# Patient Record
Sex: Male | Born: 1990 | Hispanic: No | Marital: Single | Smoking: Never smoker
Health system: Southern US, Community
[De-identification: ages and names within clinical notes are randomized; demographics above are authoritative.]

---
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
## 2013-07-07 ENCOUNTER — Encounter (HOSPITAL_COMMUNITY): Payer: Self-pay | Admitting: Emergency Medicine

## 2013-07-07 ENCOUNTER — Emergency Department (HOSPITAL_COMMUNITY)
Admission: EM | Admit: 2013-07-07 | Discharge: 2013-07-07 | Disposition: A | Discharge status: Home / Self Care | Payer: BC Managed Care – PPO | Attending: Emergency Medicine | Admitting: Emergency Medicine

## 2013-07-07 ENCOUNTER — Emergency Department (HOSPITAL_COMMUNITY): Payer: BC Managed Care – PPO

## 2013-07-07 DIAGNOSIS — R079 Chest pain, unspecified: Secondary | ICD-10-CM

## 2013-07-07 DIAGNOSIS — Z7982 Long term (current) use of aspirin: Secondary | ICD-10-CM | POA: Insufficient documentation

## 2013-07-07 LAB — CBC
HEMATOCRIT: 44.1 % (ref 39.0–52.0)
HEMOGLOBIN: 15.3 g/dL (ref 13.0–17.0)
MCH: 30.8 pg (ref 26.0–34.0)
MCHC: 34.7 g/dL (ref 30.0–36.0)
MCV: 88.9 fL (ref 78.0–100.0)
Platelets: 220 10*3/uL (ref 150–400)
RBC: 4.96 MIL/uL (ref 4.22–5.81)
RDW: 14.3 % (ref 11.5–15.5)
WBC: 5 10*3/uL (ref 4.0–10.5)

## 2013-07-07 LAB — BASIC METABOLIC PANEL
BUN: 13 mg/dL (ref 6–23)
CALCIUM: 9.8 mg/dL (ref 8.4–10.5)
CO2: 26 mEq/L (ref 19–32)
CREATININE: 0.93 mg/dL (ref 0.50–1.35)
Chloride: 103 mEq/L (ref 96–112)
GLUCOSE: 97 mg/dL (ref 70–99)
POTASSIUM: 4.4 meq/L (ref 3.7–5.3)
Sodium: 141 mEq/L (ref 137–147)

## 2013-07-07 LAB — I-STAT TROPONIN, ED: Troponin i, poc: 0 ng/mL (ref 0.00–0.08)

## 2013-07-07 MED ORDER — IBUPROFEN 800 MG PO TABS
800.0000 mg | ORAL_TABLET | Freq: Once | ORAL | Status: AC
  Administered 2013-07-07: 800 mg via ORAL
  Filled 2013-07-07: qty 1

## 2013-07-07 NOTE — ED Provider Notes (Signed)
CSN: [id number]     Arrival date & time 07/07/13  1018 History   First MD Initiated Contact with Patient 07/07/13 1225     Chief Complaint  Patient presents with  . Chest Pain    HPI  Eric Mcclure is a 23 y.o. male with no PMH who presents to the ED for evaluation of chest pain.  History was provided by the patient. Patient states that he woke up this morning at 3 am with left sided chest pain without radiation. His pain is constant and describes as a sharp pain. Movement and deep breathing makes his pain worse. He states he had a "raspy voice" with his chest pain this morning but this has resolved. He took 200 mg Ibuprofen with no relief in his pain. He has never had similar pain in the past. He states he worked out two days ago with chest lifting and didn't have any problems. He went to an urgent care today where he had an abnormal EKG and sent to the ED. Patient given aspirin. He denies any nausea, diaphoresis, lightheadedness, with his chest pain. Has been stressed with college but otherwise well. No fevers, rhinorrhea, congestion, sore throat, abdominal pain, emesis, dysuria, diarrhea, constipation, headaches, or weakness. No tobacco hx. No cocaine use. No significant FH of cardiac diease. No history of DVT, PE, clotting disorder, cancer, recent surgery, immobilization, travel.    History reviewed. No pertinent past medical history. History reviewed. No pertinent past surgical history. History reviewed. No pertinent family history. History  Substance Use Topics  . Smoking status: Never Smoker   . Smokeless tobacco: Not on file  . Alcohol Use: Yes    Review of Systems  Constitutional: Negative for fever, chills, diaphoresis, activity change, appetite change and fatigue.  HENT: Negative for congestion, rhinorrhea and sore throat.   Eyes: Negative for photophobia and visual disturbance.  Respiratory: Negative for cough, shortness of breath and wheezing.   Cardiovascular: Positive  for chest pain. Negative for leg swelling.  Gastrointestinal: Negative for nausea, vomiting, abdominal pain, diarrhea and constipation.  Genitourinary: Negative for dysuria.  Musculoskeletal: Negative for back pain, myalgias and neck pain.  Skin: Negative for rash.  Neurological: Negative for dizziness, syncope, weakness, light-headedness, numbness and headaches.     Allergies  Review of patient's allergies indicates no known allergies.  Home Medications   Current Outpatient Rx  Name  Route  Sig  Dispense  Refill  . aspirin 81 MG tablet   Oral   Take 243 mg by mouth once.         Marland Kitchen ibuprofen (ADVIL,MOTRIN) 200 MG tablet   Oral   Take 200 mg by mouth every 6 (six) hours as needed.          BP 128/78  Pulse 78  Temp(Src) 97.8 F (36.6 C) (Oral)  Resp 26  Ht 5\' 6"  (1.676 m)  Wt 179 lb 6.4 oz (81.375 kg)  BMI 28.97 kg/m2  SpO2 100%  Filed Vitals:   07/07/13 1245 07/07/13 1300 07/07/13 1315 07/07/13 1332  BP: 127/84 133/90 127/71 131/87  Pulse: 71 70 79 73  Temp:      TempSrc:      Resp: 24 20 23 23   Height:      Weight:      SpO2: 100% 100% 99% 100%    Physical Exam  Nursing note and vitals reviewed. Constitutional: He is oriented to person, place, and time. He appears well-developed and well-nourished. No distress.  HENT:  Head: Normocephalic and atraumatic.  Right Ear: External ear normal.  Left Ear: External ear normal.  Nose: Nose normal.  Mouth/Throat: Oropharynx is clear and moist.  Eyes: Conjunctivae are normal. Right eye exhibits no discharge. Left eye exhibits no discharge.  Neck: Normal range of motion. Neck supple.  Cardiovascular: Normal rate, regular rhythm, normal heart sounds and intact distal pulses.  Exam reveals no gallop and no friction rub.   No murmur heard. Dorsalis pedis pulses present and equal bilaterally  Pulmonary/Chest: Effort normal and breath sounds normal. No respiratory distress. He has no wheezes. He has no rales. He  exhibits tenderness.  Mid-sternal tenderness to palpation. Pain increased with left shoulder pulled across his chest   Abdominal: Soft. He exhibits no distension and no mass. There is no tenderness. There is no rebound and no guarding.  Musculoskeletal: Normal range of motion. He exhibits no edema and no tenderness.  No LE edema or calf tenderness bilaterally  Neurological: He is alert and oriented to person, place, and time.  Skin: Skin is warm and dry. He is not diaphoretic.    ED Course  Procedures (including critical care time) Labs Review Labs Reviewed  CBC  BASIC METABOLIC PANEL  Rosezena Sensor, ED   Imaging Review Dg Chest 2 View  07/07/2013   CLINICAL DATA:  Chest pain  EXAM: CHEST  2 VIEW  COMPARISON:  November 25, 2006  FINDINGS: Lungs are clear. Heart size and pulmonary vascularity are normal. No adenopathy. No bone lesions. No pneumothorax.  IMPRESSION: No abnormality noted.   Electronically Signed   By: Bretta Bang M.D.   On: 07/07/2013 10:49     EKG Interpretation   Date/Time:  Friday July 07 2013 10:26:52 EST Ventricular Rate:  77 PR Interval:  182 QRS Duration: 84 QT Interval:  362 QTC Calculation: 409 R Axis:   80 Text Interpretation:  Normal sinus rhythm Early repolarization Normal ECG  No previous tracing Confirmed by Anitra Lauth  MD, Alphonzo Lemmings ([id number]) on 07/07/2013  1:31:42 PM      Results for orders placed during the hospital encounter of 07/07/13  CBC      Result Value Ref Range   WBC 5.0  4.0 - 10.5 K/uL   RBC 4.96  4.22 - 5.81 MIL/uL   Hemoglobin 15.3  13.0 - 17.0 g/dL   HCT 60.4  54.0 - 98.1 %   MCV 88.9  78.0 - 100.0 fL   MCH 30.8  26.0 - 34.0 pg   MCHC 34.7  30.0 - 36.0 g/dL   RDW 19.1  47.8 - 29.5 %   Platelets 220  150 - 400 K/uL  BASIC METABOLIC PANEL      Result Value Ref Range   Sodium 141  137 - 147 mEq/L   Potassium 4.4  3.7 - 5.3 mEq/L   Chloride 103  96 - 112 mEq/L   CO2 26  19 - 32 mEq/L   Glucose, Bld 97  70 - 99 mg/dL   BUN  13  6 - 23 mg/dL   Creatinine, Ser 6.21  0.50 - 1.35 mg/dL   Calcium 9.8  8.4 - 30.8 mg/dL   GFR calc non Af Amer >90  >90 mL/min   GFR calc Af Amer >90  >90 mL/min  I-STAT TROPOININ, ED      Result Value Ref Range   Troponin i, poc 0.00  0.00 - 0.08 ng/mL   Comment 3  DG Chest 2 View (Final result)  Result time: 07/07/13 10:49:15    Final result by Rad Results In Interface (07/07/13 10:49:15)    Narrative:   CLINICAL DATA: Chest pain  EXAM: CHEST 2 VIEW  COMPARISON: November 25, 2006  FINDINGS: Lungs are clear. Heart size and pulmonary vascularity are normal. No adenopathy. No bone lesions. No pneumothorax.  IMPRESSION: No abnormality noted.   Electronically Signed By: Bretta BangWilliam Woodruff M.D. On: 07/07/2013 10:49          MDM   Christiana FuchsGregory T Peppers is a 23 y.o. male with no PMH who presents to the ED for evaluation of chest pain.    Etiology of chest pain possibly musculoskeletal in nature. Pain reproducible on exam. Chest x-ray negative for an acute cardiopulmonary process. EKG negative for any acute ischemic changes. [facility] for any acute ischemic changes. Troponin negative. Labs unremarkable. Wells score 0 and PERC negative. Low suspicion for PE. No respiratory complaints. No hypoxia, respiratory distress, or tachypnea. Lungs clear to auscultation. Patient had improvements in his pain with Ibuprofen. Has appointment with PCP. Return precautions, discharge instructions, and follow-up was discussed with the patient before discharge.     Discharge Medication List as of 07/07/2013  1:48 PM       Final impressions: 1. Chest pain       Luiz IronJessica Katlin Curtistine Pettitt PA-C   This patient was discussed with Dr.Plunkett          Jillyn LedgerJessica K Laylanie Kruczek, PA-C 07/08/13 1212

## 2013-07-07 NOTE — ED Notes (Signed)
Pt comfortable with d/c and f/u instructions. No Prescriptions.

## 2013-07-07 NOTE — ED Notes (Addendum)
Pt states he was resting this morning and began to have a tightness in the L side of his chest. It hurts worse to bend, move or breathe. States he worked out 2 days ago and lifted weights but he does this type of workout on a regular basis. Denies cardiac history. He went to an ucc and he had an ekg that "was abnormal" so they wanted him to come to er for evaluation

## 2013-07-07 NOTE — Discharge Instructions (Signed)
Rest and take ibuprofen for pain (600-800 mg every 6-8 hours)  Return to the emergency department if you develop any changing/worsening condition, difficulty breathing, fever, coughing up blood, or any other concerns (please read additional information regarding your condition below)   Chest Pain (Nonspecific) It is often hard to give a specific diagnosis for the cause of chest pain. There is always a chance that your pain could be related to something serious, such as a heart attack or a blood clot in the lungs. You need to follow up with your caregiver for further evaluation. CAUSES   Heartburn.  Pneumonia or bronchitis.  Anxiety or stress.  Inflammation around your heart (pericarditis) or lung (pleuritis or pleurisy).  A blood clot in the lung.  A collapsed lung (pneumothorax). It can develop suddenly on its own (spontaneous pneumothorax) or from injury (trauma) to the chest.  Shingles infection (herpes zoster virus). The chest wall is composed of bones, muscles, and cartilage. Any of these can be the source of the pain.  The bones can be bruised by injury.  The muscles or cartilage can be strained by coughing or overwork.  The cartilage can be affected by inflammation and become sore (costochondritis). DIAGNOSIS  Lab tests or other studies, such as X-rays, electrocardiography, stress testing, or cardiac imaging, may be needed to find the cause of your pain.  TREATMENT   Treatment depends on what may be causing your chest pain. Treatment may include:  Acid blockers for heartburn.  Anti-inflammatory medicine.  Pain medicine for inflammatory conditions.  Antibiotics if an infection is present.  You may be advised to change lifestyle habits. This includes stopping smoking and avoiding alcohol, caffeine, and chocolate.  You may be advised to keep your head raised (elevated) when sleeping. This reduces the chance of acid going backward from your stomach into your  esophagus.  Most of the time, nonspecific chest pain will improve within 2 to 3 days with rest and mild pain medicine. HOME CARE INSTRUCTIONS   If antibiotics were prescribed, take your antibiotics as directed. Finish them even if you start to feel better.  For the next few days, avoid physical activities that bring on chest pain. Continue physical activities as directed.  Do not smoke.  Avoid drinking alcohol.  Only take over-the-counter or prescription medicine for pain, discomfort, or fever as directed by your caregiver.  Follow your caregiver's suggestions for further testing if your chest pain does not go away.  Keep any follow-up appointments you made. If you do not go to an appointment, you could develop lasting (chronic) problems with pain. If there is any problem keeping an appointment, you must call to reschedule. SEEK MEDICAL CARE IF:   You think you are having problems from the medicine you are taking. Read your medicine instructions carefully.  Your chest pain does not go away, even after treatment.  You develop a rash with blisters on your chest. SEEK IMMEDIATE MEDICAL CARE IF:   You have increased chest pain or pain that spreads to your arm, neck, jaw, back, or abdomen.  You develop shortness of breath, an increasing cough, or you are coughing up blood.  You have severe back or abdominal pain, feel nauseous, or vomit.  You develop severe weakness, fainting, or chills.  You have a fever. THIS IS AN EMERGENCY. Do not wait to see if the pain will go away. Get medical help at once. Call your local emergency services (911 in [place] Do not drive yourself to the  hospital. MAKE SURE YOU:   Understand these instructions.  Will watch your condition.  Will get help right away if you are not doing well or get worse. Document Released: 01/28/2005 Document Revised: 07/13/2011 Document Reviewed: 11/24/2007 [facility] Patient Information 2014 [place], [place].   Chest Wall  Pain Chest wall pain is pain in or around the bones and muscles of your chest. It may take up to 6 weeks to get better. It may take longer if you must stay physically active in your work and activities.  CAUSES  Chest wall pain may happen on its own. However, it may be caused by:  A viral illness like the flu.  Injury.  Coughing.  Exercise.  Arthritis.  Fibromyalgia.  Shingles. HOME CARE INSTRUCTIONS   Avoid overtiring physical activity. Try not to strain or perform activities that cause pain. This includes any activities using your chest or your abdominal and side muscles, especially if heavy weights are used.  Put ice on the sore area.  Put ice in a plastic bag.  Place a towel between your skin and the bag.  Leave the ice on for 15-20 minutes per hour while awake for the first 2 days.  Only take over-the-counter or prescription medicines for pain, discomfort, or fever as directed by your caregiver. SEEK IMMEDIATE MEDICAL CARE IF:   Your pain increases, or you are very uncomfortable.  You have a fever.  Your chest pain becomes worse.  You have new, unexplained symptoms.  You have nausea or vomiting.  You feel sweaty or lightheaded.  You have a cough with phlegm (sputum), or you cough up blood. MAKE SURE YOU:   Understand these instructions.  Will watch your condition.  Will get help right away if you are not doing well or get worse. Document Released: 04/20/2005 Document Revised: 07/13/2011 Document Reviewed: 12/15/2010 [facility] Patient Information 2014 [place], [place].   Emergency Department Resource Guide 1) Find a Doctor and Pay Out of Pocket Although you won't have to find out who is covered by your insurance plan, it is a good idea to ask around and get recommendations. You will then need to call the office and see if the doctor you have chosen will accept you as a new patient and what types of options they offer for patients who are self-pay. Some  doctors offer discounts or will set up payment plans for their patients who do not have insurance, but you will need to ask so you aren't surprised when you get to your appointment.  2) Contact Your Local Health Department Not all health departments have doctors that can see patients for sick visits, but many do, so it is worth a call to see if yours does. If you don't know where your local health department is, you can check in your phone book. The CDC also has a tool to help you locate your state's health department, and many state websites also have listings of all of their local health departments.  3) Find a Walk-in Clinic If your illness is not likely to be very severe or complicated, you may want to try a walk in clinic. These are popping up all over the country in pharmacies, drugstores, and shopping centers. They're usually staffed by nurse practitioners or physician assistants that have been trained to treat common illnesses and complaints. They're usually fairly quick and inexpensive. However, if you have serious medical issues or chronic medical problems, these are probably not your best option.  No Primary Care Doctor: -  Call Health Connect at  [phone] - they can help you locate a primary care doctor that  accepts your insurance, provides certain services, etc. - Physician Referral Service- [phone]  Chronic Pain Problems: Organization         Address  Phone   Notes  [facility]  [phone] Patients need to be referred by their primary care doctor.   Medication Assistance: Organization         Address  Phone   Notes  [facility] [address] [place] [place], [place] [address] [phone] --Must be a resident of [facility] -- Must have NO insurance coverage whatsoever (no Medicaid/ Medicare, etc.) -- The pt. MUST have a primary care doctor that directs their care regularly and follows them in the  community   [facility]ist  [phone]   [employer]  [phone]    Agencies that provide inexpensive medical care: Organization         Address  Phone   Notes  [facility]  [phone]   [facility]    [phone]   [facility] [address] [place], [place] [address] [phone]   [facility] [address] [place] [place] [phone]   Planned Parenthood    [phone]   [facility]    [phone]   [facility] and [facility]  [address], [place] Phone:  [phone], Fax:  [phone] Hours of Operation:  9 am - 6 pm, M-F.  Also accepts Medicaid/Medicare and self-pay.  [facility]  [address], [place] Phone: [phone], Fax: [phone]. Hours of Operation:  8:30 am - 5:30 pm, M-F.  Also accepts Medicaid and self-pay.  [facility] [place] [address], [place] Phone: [phone]   [facility] [address] Natasha Bence [place], [place] [phone], Ext. [phone] Mondays & Thursdays: 7-9 AM.  First 15 patients are seen on a first come, first serve basis.    Medicaid-accepting [facility] Providers:  Organization         Address  Phone   Notes  [facility] [address], [place] [phone] Also accepts self-pay patients.  [facility] [address] Laurell Josephs [place], [place]  [phone]   [facility] [address], [place] [phone]   [facility] [address] [place] [phone]   Renaye Rakers [address], [place]   [phone] Only accepts [place] Access [place] patients after they have their name applied to their card.   Self-Pay (no insurance) in [facility]:  Organization         Address  Phone   Notes  Sickle Cell Patients, [facility]  Internal Medicine [address] [place], [place] [phone]   [facility] [address] [place], [place] [phone]   [facility] [place]  [address], [place] [phone]   [facility]/Dr. Osei-Bonsu  [address], [place] or [address], [place] [phone] Phone number for both [place] and [place] locations is the same.  Urgent Medical and [facility] [address], [place] Otto [phone]   [facility]  [address] [place] [address], [place] or [address] [phone]   [facility] [address], [place] [phone], phone; [phone], fax Sees patients 1st and 3rd Saturday of every month.  Must not qualify for public or private insurance (i.e. Medicaid, Medicare, [place] Health Choice, Veterans' Benefits)  Household income should be no more than 200% of the poverty level The clinic cannot treat you if you are pregnant or think you are pregnant  Sexually transmitted diseases are not treated at the clinic.    Dental Care: Organization         Address  Phone  Notes  [facility] [address], [place] [phone] Accepts children up to age 421 who are enrolled in [place] or [place] Health Choice; pregnant women with a Medicaid card; and children who have applied for Medicaid or [place] Health Choice, but were declined, whose parents can pay a reduced fee at time of service.  [facility] [place]  [address], [place] [phone] Accepts children up to age 25 who are enrolled in [place] or [place] Health Choice; pregnant women with a Medicaid card; and children who have applied for Medicaid or [place] Health Choice, but were declined, whose parents can pay a reduced fee at time of service.  [facility]  [address], [place] [phone] Patients are seen by appointment only. Walk-ins are not accepted. [facility] will see patients 25 years of age and older. Monday - Tuesday (8am-5pm) Most Wednesdays (8:30-5pm) $30 per visit, cash only  [facility]  [address], [facility] [place] [phone] Patients are seen by appointment only. Walk-ins are not accepted. [facility] will see patients 25 years of age and older. One Wednesday Evening (Monthly: Volunteer Based).  $30 per visit, cash only  [facility]  [phone] for adults; Children under age 884, call Graduate Pediatric Dentistry at [phone]. Children aged 424-14, please call [phone] to request a pediatric application.  Dental services are provided in all areas of dental care including fillings, crowns and bridges, complete and partial dentures, implants, gum treatment, root canals, and extractions. Preventive care is also provided. Treatment is provided to both adults and children. Patients are selected via a lottery and there is often a waiting list.   [facility] [address], [place]  [phone] [email or web address]   [facility] [address], [place], [place] [phone], Ext. [phone] Second and Fourth Thursday of each month, opens at 6:30 AM; Clinic ends at 9 AM.  Patients are seen on a first-come first-served basis, and a limited number are seen during each clinic.   [facility]  [address], [place], [place] [phone]   Eligibility Requirements You must have lived in [place], [place], or [place] counties for at least the last three months.   You cannot be eligible for state or federal sponsored [place] Cityhealthcare insurance, including CIGNAVeterans Administration, [place]icaid, or [employer]icare.   You generally cannot be eligible for healthcare insurance through your employer.    How to apply: Eligibility screenings are held every  Tuesday and Wednesday afternoon from 1:00 pm until 4:00 pm. You do not need an appointment for the interview!  [facility] [address], [place], [place] [phone]   [facility]  [facility]  [phone]   [facility] Health Department  [phone]   [facility] Health Department  [phone]    Behavioral Health Resources in the Community: Intensive Outpatient Programs Organization         Address  Phone  Notes  [facility] [address] [place], [place] [phone]   [facility] Outpatient [address], [place], [place] [phone]   [facility] [address], [place], [place]  [phone]   [facility] Mental Health [address],  [place], [place] [phone] or [phone]   Substance Abuse Resources Organization         Address  Phone  Notes  [facility]  [phone]   [facility]  [phone]   [facility]  [phone]   Floydene Flock  [phone]   Residential & Outpatient Substance Abuse Program  [phone]   Psychological Services Organization         Address  Phone  Notes  [facility] Health  [phone]   [facility]  [phone]   [facility] [place] [facility] [address] [place] [phone] or [phone]    Mobile Crisis Teams Organization         Address  Phone  Notes  [facility], Mobile Crisis Care Unit  [phone]   [facility]  [address] [place], [place] [phone]   Doristine Locks [address] [place] [phone]    Self-Help/Support Groups Organization         Address  Phone             Notes  [facility] of [place] - variety of support groups  [phone]- I7437963 Call for more information  Narcotics Anonymous (NA), [facility] [address], [employer] [place]  2 meetings at this location    Statistician         Address  Phone  Notes  [facility] [address],    [place]  [phone]   [facility]  [address] [place] [address], [place], [place] [phone]   [facility] [address] [place], [place] [phone] Admissions: 8am-3pm M-F  [facility] [address],    [place], [place] [phone]   [facility] [address] [place], [place], [place] [phone]   [facility] [address],  [place], [place] [phone]   [facility] - Intensive Outpatient [address] Laurell Josephs [address], [place], [place] [phone]   [facility] ([facility] [address] [place].,  [place], [place] [phone] or [phone]   [facility] ([facility]) [address] [place], [place] [phone] Accepts Medicaid  [facility] [address],  [place] [phone] Substance Abuse/Addiction Treatment   [facility] Organization         Address  Phone  Notes  [facility]  [phone]   Angie Fava, PhD [address] Ervin Knack [place], [place]   [phone] or [phone]   [facility]   [address] [place], [place] [phone]   [facility] [address], [place], [place] [phone] Insurance/Medicaid/sponsorship through [employer] and Families [address]  [place], [place] [phone] [place], [place] [phone]    Dr. Adele Schilder  [phone]   [facility] Dept. 1) [address], [place] 2) [place] 3)  [place] [address], [place] [phone]  [phone]   [place] [phone] or [phone] (After  Hours)

## 2013-07-09 NOTE — ED Provider Notes (Signed)
Medical screening examination/treatment/procedure(s) were performed by non-physician practitioner and as supervising physician I was immediately available for consultation/collaboration.   EKG Interpretation   Date/Time:  Friday July 07 2013 10:26:52 EST Ventricular Rate:  77 PR Interval:  182 QRS Duration: 84 QT Interval:  362 QTC Calculation: 409 R Axis:   80 Text Interpretation:  Normal sinus rhythm Early repolarization Normal ECG  No previous tracing Confirmed by Anitra LauthPLUNKETT  MD, Lillith Mcneff ([id number]) on 07/07/2013  1:31:42 PM        Gwyneth SproutWhitney Jaileen Janelle, MD 07/09/13 [phone]

## 2015-10-28 ENCOUNTER — Other Ambulatory Visit (HOSPITAL_COMMUNITY): Payer: Self-pay | Admitting: Family Medicine

## 2015-10-28 ENCOUNTER — Other Ambulatory Visit: Payer: Self-pay | Admitting: Family Medicine

## 2015-10-28 DIAGNOSIS — R1011 Right upper quadrant pain: Secondary | ICD-10-CM

## 2015-10-29 ENCOUNTER — Ambulatory Visit
Admission: RE | Admit: 2015-10-29 | Discharge: 2015-10-29 | Disposition: A | Discharge status: Home / Self Care | Payer: 59 | Source: Ambulatory Visit | Attending: Family Medicine | Admitting: Family Medicine

## 2015-10-29 DIAGNOSIS — R1011 Right upper quadrant pain: Secondary | ICD-10-CM

## 2017-12-14 IMAGING — US US ABDOMEN LIMITED
1 series · 14 of 25 positions shown · non-contrast
Comparison: None in PACs

CLINICAL DATA: Three days of right upper quadrant pain associated
with nausea

EXAM:
US ABDOMEN LIMITED - RIGHT UPPER QUADRANT

[Series 1: us abdomen limited · 0.32mm/px · 14 of 42 slices shown]
[im 1/42]
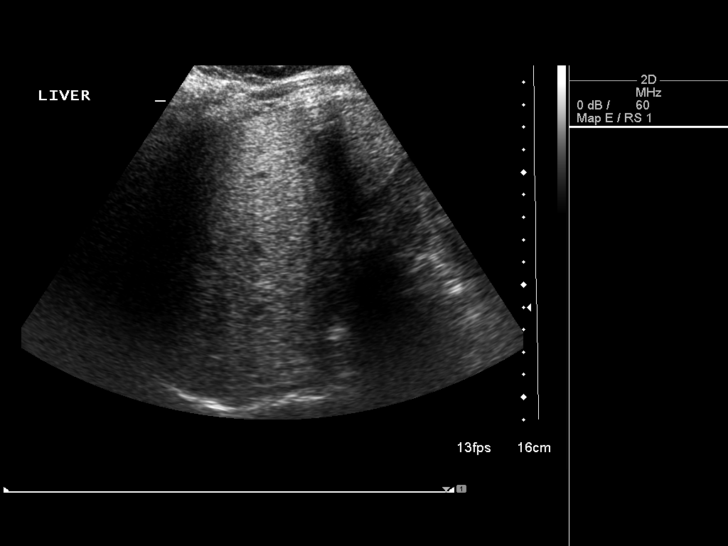
[im 4/42]
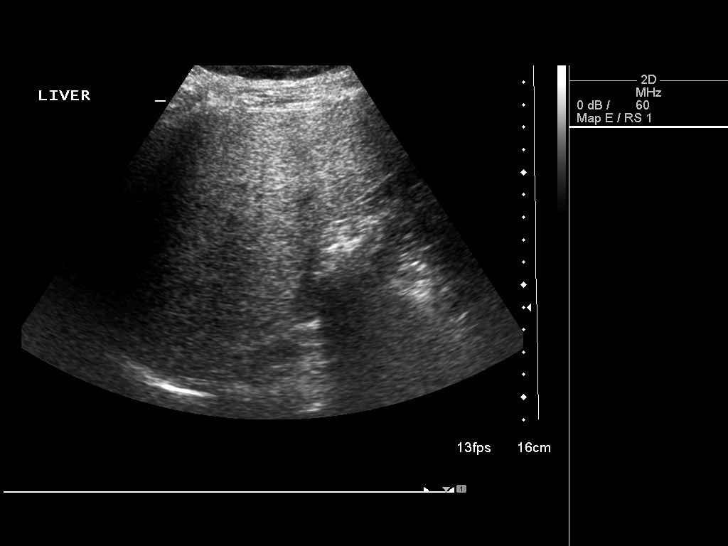
[im 7/42]
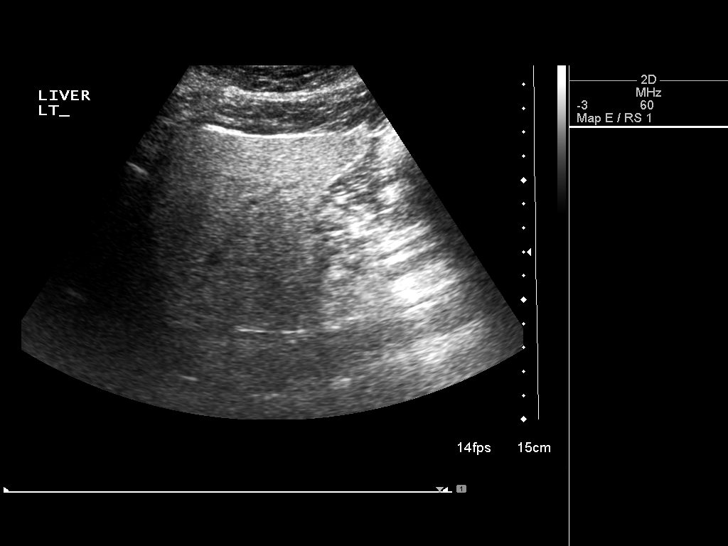
[im 11/42]
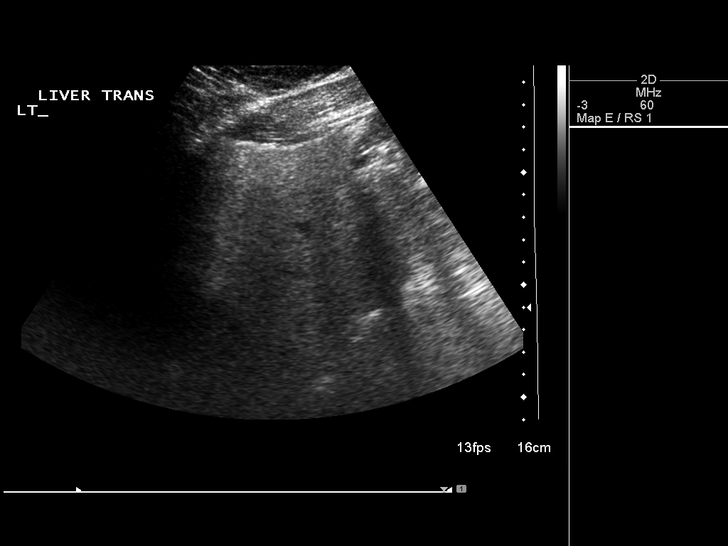
[im 14/42]
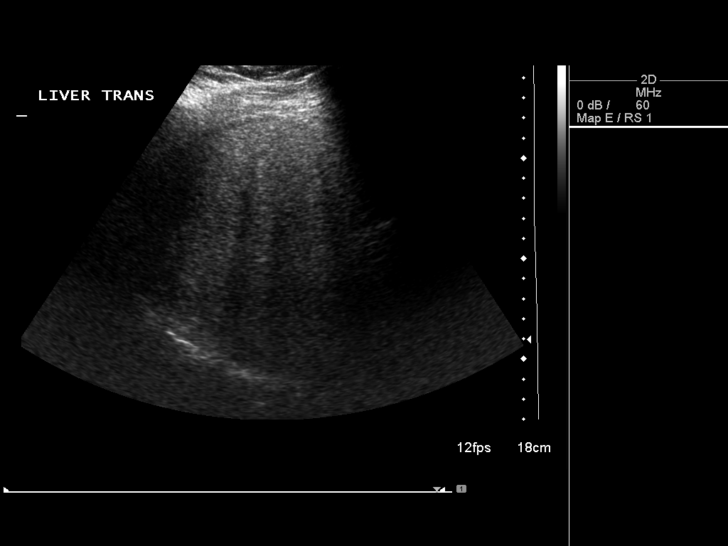
[im 16/42]
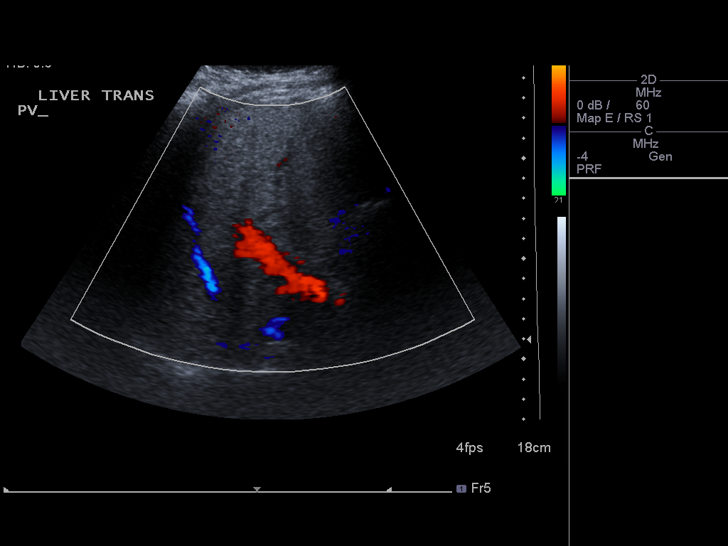
[im 19/42]
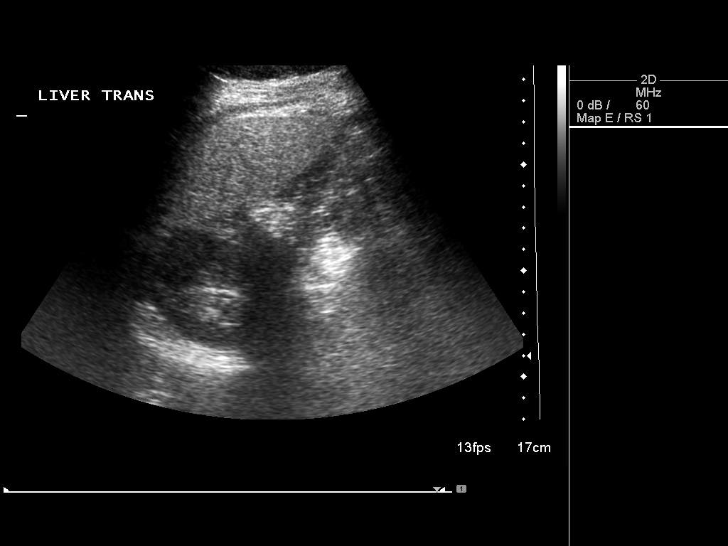
[im 23/42]
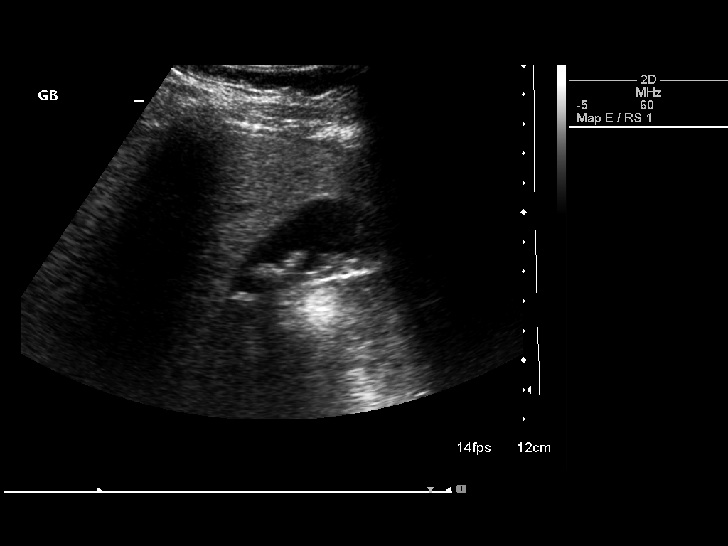
[im 26/42]
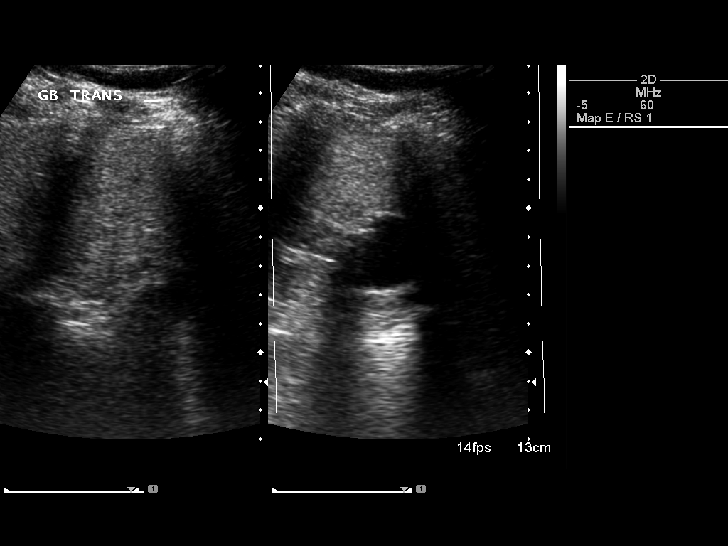
[im 28/42]
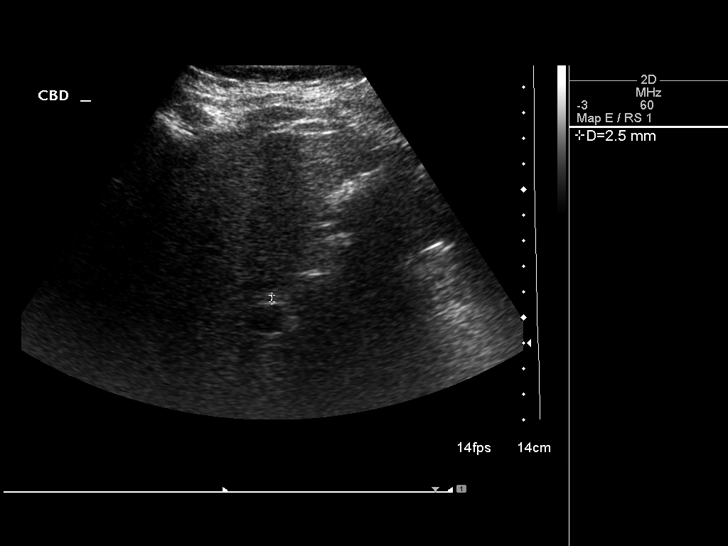
[im 31/42]
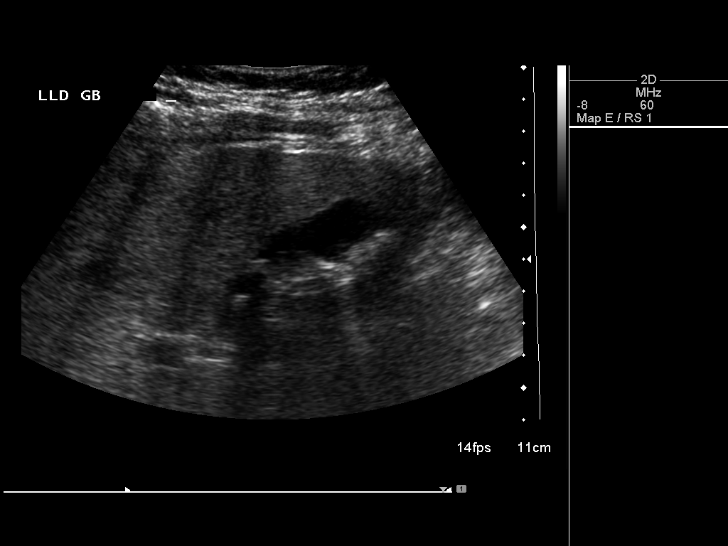
[im 35/42]
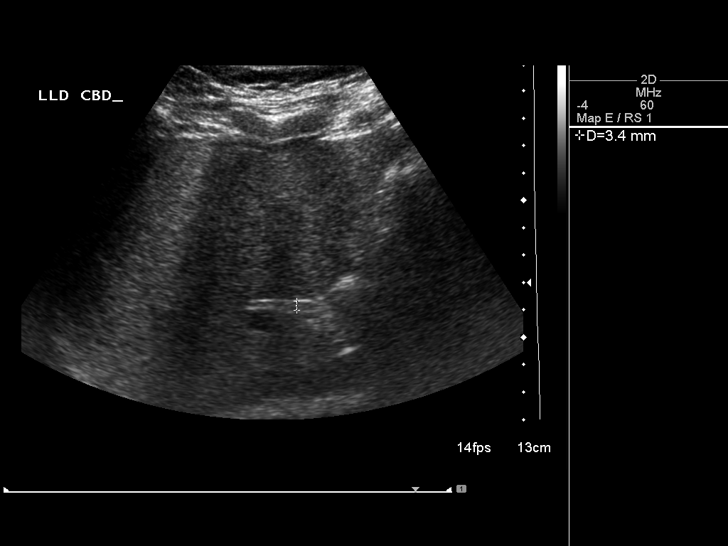
[im 38/42]
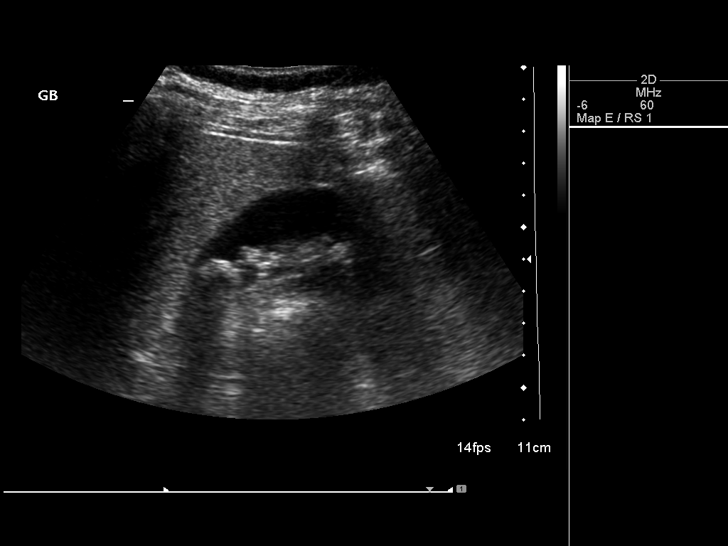
[im 42/42]
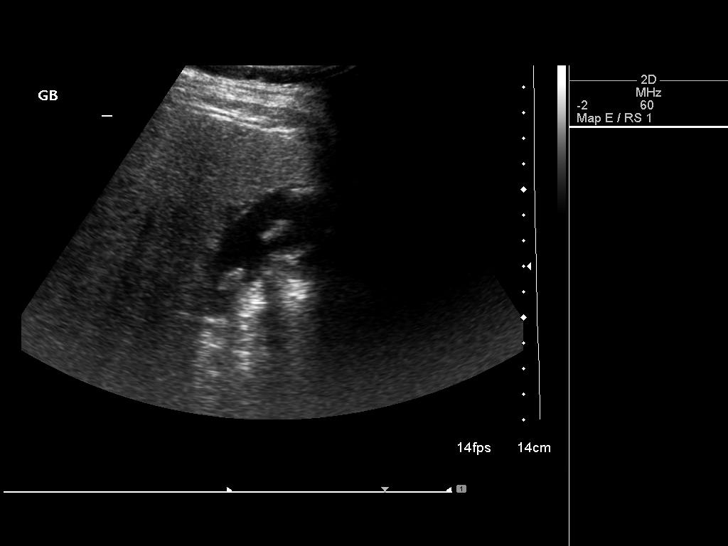

[14 of 25 positions shown; findings below may reference images not displayed]

FINDINGS: Gallbladder:

The gallbladder is adequately distended. There are multiple
echogenic mobile shadowing stones with the largest measuring 14 mm
in diameter. There is no gallbladder wall thickening,
pericholecystic fluid, or positive sonographic Murphy's sign.

Common bile duct:

Diameter: 3.4 mm.

Liver:

The hepatic echotexture is subjectively increased. An area of focal
fatty sparing adjacent to the gallbladder is suspected.
IMPRESSION: 1. Gallstones without sonographic evidence of acute cholecystitis.
2. Probable fatty infiltrative change of the liver.

## 2021-11-24 ENCOUNTER — Other Ambulatory Visit: Payer: Self-pay

## 2021-11-24 ENCOUNTER — Ambulatory Visit
Admission: EM | Admit: 2021-11-24 | Discharge: 2021-11-24 | Disposition: A | Discharge status: Home / Self Care | Payer: Managed Care, Other (non HMO) | Attending: Emergency Medicine | Admitting: Emergency Medicine

## 2021-11-24 ENCOUNTER — Encounter: Payer: Self-pay | Admitting: Emergency Medicine

## 2021-11-24 DIAGNOSIS — L739 Follicular disorder, unspecified: Secondary | ICD-10-CM

## 2021-11-24 MED ORDER — CEPHALEXIN 500 MG PO CAPS: 1000.0000 mg | ORAL_CAPSULE | Freq: Two times a day (BID) | ORAL | 0 refills | Status: AC

## 2021-11-24 MED ORDER — TRIAMCINOLONE ACETONIDE 0.1 % EX CREA: 1.0000 | TOPICAL_CREAM | Freq: Two times a day (BID) | CUTANEOUS | 0 refills | Status: AC

## 2021-11-24 MED ORDER — CHLORHEXIDINE GLUCONATE 4 % EX LIQD: Freq: Every day | CUTANEOUS | 0 refills | Status: AC | PRN

## 2021-11-24 NOTE — Discharge Instructions (Addendum)
Keep clean with Hibiclens soap and water.  Finish the flex, even if you feel better.  You can try warm compresses.  Continue Claritin and hydrocortisone for itching as needed.  The triamcinolone is if the Claritin and the hydrocortisone stop working.

## 2021-11-24 NOTE — ED Provider Notes (Signed)
HPI  SUBJECTIVE:  Eric Mcclure is a 31 y.o. male who presents with an occasionally pruritic, pustular, erythematous papular rash starting 2 days ago after being outside.  He also ate some undercooked eggs and some nuts that day, which he does not usually do.  He states that the rash is spreading/becoming more prominent.  He recently shaved this area.  No fevers, body aches.  No known exposure to poison ivy, poison oak.  Itching is primarily during the day.  No sensation of being bitten at night, blood on the bedclothes in the morning, pets in the house, recent swimming.  No preceding herald patch.  He also states that he wore a new shirt without washing it.  There has been no change in medications.  He tried Claritin 1% OTC hydrocortisone cream several hours prior to evaluation with improvement in the itching.  No aggravating factors.  He has a past medical history of hypertension.  No history of diabetes, MRSA.    History reviewed. No pertinent past medical history.  History reviewed. No pertinent surgical history.  Family History  Problem Relation Age of Onset   Hypertension Mother    Healthy Father     Social History   Tobacco Use   Smoking status: Never  Vaping Use   Vaping Use: Never used  Substance Use Topics   Alcohol use: Yes   Drug use: No    No current facility-administered medications for this encounter.  Current Outpatient Medications:    cephALEXin (KEFLEX) 500 MG capsule, Take 2 capsules (1,000 mg total) by mouth 2 (two) times daily for 5 days., Disp: 20 capsule, Rfl: 0   chlorhexidine (HIBICLENS) 4 % external liquid, Apply topically daily as needed. Dilute 10-15 mL in water, Use daily when bathing for 1-2 weeks, Disp: 120 mL, Rfl: 0   triamcinolone cream (KENALOG) 0.1 %, Apply 1 Application topically 2 (two) times daily. Apply for 2 weeks. May use on face, Disp: 30 g, Rfl: 0   amLODipine (NORVASC) 5 MG tablet, Take 5 mg by mouth daily., Disp: , Rfl:    aspirin  81 MG tablet, Take 243 mg by mouth once., Disp: , Rfl:   No Known Allergies   ROS  As noted in HPI.   Physical Exam  BP (!) 137/95 (BP Location: Left Arm)   Pulse 87   Temp 98.4 F (36.9 C) (Oral)   Resp 18   SpO2 98%   Constitutional: Well developed, well nourished, no acute distress Eyes:  EOMI, conjunctiva normal bilaterally HENT: Normocephalic, atraumatic,mucus membranes moist Respiratory: Normal inspiratory effort Cardiovascular: Normal rate GI: nondistended skin: Nontender erythematous papular pustular rash over abdomen.  No herald patch.  No rash elsewhere.   Musculoskeletal: no deformities Neurologic: Alert & oriented x 3, no focal neuro deficits Psychiatric: Speech and behavior appropriate   ED Course   Medications - No data to display  No orders of the defined types were placed in this encounter.   No results found for this or any previous visit (from the past 24 hour(s)). No results found.  ED Clinical Impression  1. Folliculitis      ED Assessment/Plan  Presentation suggestive of a folliculitis.  Will send home with Keflex for 5 days, Hibiclens.  Continue Claritin, OTC hydrocortisone cream for itching.  Will send home with a wait-and-see prescription of triamcinolone cream.  Follow-up with PCP as needed, or may return here.  Discussed MDM, treatment plan, and plan for follow-up with patient.  patient  agrees with plan.   Meds ordered this encounter  Medications   chlorhexidine (HIBICLENS) 4 % external liquid    Sig: Apply topically daily as needed. Dilute 10-15 mL in water, Use daily when bathing for 1-2 weeks    Dispense:  120 mL    Refill:  0   cephALEXin (KEFLEX) 500 MG capsule    Sig: Take 2 capsules (1,000 mg total) by mouth 2 (two) times daily for 5 days.    Dispense:  20 capsule    Refill:  0   triamcinolone cream (KENALOG) 0.1 %    Sig: Apply 1 Application topically 2 (two) times daily. Apply for 2 weeks. May use on face     Dispense:  30 g    Refill:  0      *This clinic note was created using Scientist, clinical (histocompatibility and immunogenetics). Therefore, there may be occasional mistakes despite careful proofreading.  ?    Domenick Gong, MD 11/24/21 1630

## 2021-11-24 NOTE — ED Triage Notes (Signed)
Saturday noticed rash to abdomen and rash itches.  Has not noticed rash on extremities.  Today took claritin, cortizone 10.  Denies fever, headaches, chills
